# Patient Record
Sex: Female | Born: 1970 | Race: White | Hispanic: No | Smoking: Current some day smoker
Health system: Southern US, Community
[De-identification: ages and names within clinical notes are randomized; demographics above are authoritative.]

## PROBLEM LIST (undated history)

## (undated) HISTORY — PX: CHOLECYSTECTOMY OPEN: SUR202

---

## 2019-11-23 DIAGNOSIS — R5383 Other fatigue: Secondary | ICD-10-CM | POA: Insufficient documentation

## 2019-12-29 DIAGNOSIS — R5381 Other malaise: Secondary | ICD-10-CM | POA: Insufficient documentation

## 2019-12-30 ENCOUNTER — Other Ambulatory Visit: Payer: Self-pay

## 2019-12-30 ENCOUNTER — Encounter: Payer: Self-pay | Admitting: Cardiology

## 2019-12-30 ENCOUNTER — Ambulatory Visit: Payer: 59 | Admitting: Cardiology

## 2019-12-30 ENCOUNTER — Ambulatory Visit (INDEPENDENT_AMBULATORY_CARE_PROVIDER_SITE_OTHER): Payer: 59

## 2019-12-30 DIAGNOSIS — R002 Palpitations: Secondary | ICD-10-CM

## 2019-12-30 DIAGNOSIS — R0789 Other chest pain: Secondary | ICD-10-CM | POA: Diagnosis not present

## 2019-12-30 DIAGNOSIS — R011 Cardiac murmur, unspecified: Secondary | ICD-10-CM | POA: Diagnosis not present

## 2019-12-30 DIAGNOSIS — R0609 Other forms of dyspnea: Secondary | ICD-10-CM

## 2019-12-30 DIAGNOSIS — F17210 Nicotine dependence, cigarettes, uncomplicated: Secondary | ICD-10-CM

## 2019-12-30 DIAGNOSIS — R079 Chest pain, unspecified: Secondary | ICD-10-CM

## 2019-12-30 DIAGNOSIS — R0600 Dyspnea, unspecified: Secondary | ICD-10-CM

## 2019-12-30 NOTE — Progress Notes (Signed)
Cardiology Office Note:    Date:  12/30/2019   ID:  Alexis Mcintyre, DOB August 11, 1970, MRN [id number]  PCP:  Ninfa Meeker, FNP  Cardiologist:  Garwin Brothers, MD   Referring MD: Bobbye Morton, *    ASSESSMENT:    1. Palpitations   2. Cardiac murmur   3. Dyspnea on exertion   4. Chest discomfort   5. Cigarette smoker    PLAN:    In order of problems listed above:  1. Primary prevention stressed with the patient.  Importance of compliance with diet medication stressed and she vocalized understanding. 2. Cigarette smoker: I spent 5 minutes with the patient discussing solely about smoking. Smoking cessation was counseled. I suggested to the patient also different medications and pharmacological interventions. Patient is keen to try stopping on its own at this time. He will get back to me if he needs any further assistance in this matter. 3. Palpitations: Recent TSH was unremarkable.  I would like to do a 2-week monitoring on her to assess her symptoms. 4. Cardiac murmur: Echocardiogram will be done to assess murmur heard on auscultation. 5. Chest discomfort: Atypical in etiology and has some shortness of breath on exertion.  These are atypical however in view of risk factors we will do a Lexiscan sestamibi.  Patient knows to go to the nearest emergency room for any concerning symptoms. 6. Patient will be seen in follow-up appointment in 3 months or earlier if the patient has any concerns    Medication Adjustments/Labs and Tests Ordered: Current medicines are reviewed at length with the patient today.  Concerns regarding medicines are outlined above.  No orders of the defined types were placed in this encounter.  No orders of the defined types were placed in this encounter.    History of Present Illness:    Alexis Mcintyre is a 49 y.o. female who is being seen today for the evaluation of palpitations at the request of Street, Stephanie Coup, *.  Patient is a  pleasant 49 year old female.  She has past medical history of essential hypertension.  She mentions palpitations on and off she underwent ZIO monitoring for 1 day which revealed brief atrial run.  No diabetes mellitus.  Her lipids are unremarkable.  She smokes a pack and a half a day but has cut down to 3 to 4 cigarettes.  No orthopnea or PND.  No syncopal spell.  At the time of my evaluation she is alert awake oriented and in no distress.  She does not exercise on a regular basis.  Sexual activity does not bring her out any chest pain.  She has some shortness of breath with it.  At the time of my evaluation, the patient is alert awake oriented and in no distress.  History reviewed. No pertinent past medical history.  Past Surgical History:  Procedure Laterality Date   CHOLECYSTECTOMY OPEN      Current Medications: Current Meds  Medication Sig   Albuterol Sulfate (VENTOLIN HFA IN) Inhale 1-25 puffs into the lungs every 4 (four) hours as needed.   Budeson-Glycopyrrol-Formoterol (BREZTRI AEROSPHERE) 160-9-4.8 MCG/ACT AERO Inhale 2 puffs into the lungs 2 (two) times daily. In the morning and evening   buPROPion (WELLBUTRIN XL) 150 MG 24 hr tablet Take 1 tablet by mouth in the morning and at bedtime.   Multiple Vitamins-Minerals (MULTIVITAMIN ADULT, MINERALS,) TABS Take 1 tablet by mouth daily.   telmisartan-hydrochlorothiazide (MICARDIS HCT) 80-12.5 MG tablet Take 1 tablet by mouth daily. For  high blood pressure   [DISCONTINUED] buPROPion (ZYBAN) 150 MG 12 hr tablet Take 150 mg by mouth daily.   [DISCONTINUED] venlafaxine XR (EFFEXOR-XR) 37.5 MG 24 hr capsule Take 37.5 mg by mouth daily.     Allergies:   Patient has no known allergies.   Social History   Socioeconomic History   Marital status: Divorced    Spouse name: Not on file   Number of children: Not on file   Years of education: Not on file   Highest education level: Not on file  Occupational History   Not on file    Tobacco Use   Smoking status: Current Some Day Smoker    Types: Cigarettes   Smokeless tobacco: Never Used   Tobacco comment: 3 cigarettes a day  Vaping Use   Vaping Use: Never used  Substance and Sexual Activity   Alcohol use: Yes    Comment: social drink    Drug use: Not Currently   Sexual activity: Not on file  Other Topics Concern   Not on file  Social History Narrative   Not on file   Social Determinants of Health   Financial Resource Strain:    Difficulty of Paying Living Expenses:   Food Insecurity:    Worried About Programme researcher, broadcasting/film/video in the Last Year:    Barista in the Last Year:   Transportation Needs:    Freight forwarder (Medical):    Lack of Transportation (Non-Medical):   Physical Activity:    Days of Exercise per Week:    Minutes of Exercise per Session:   Stress:    Feeling of Stress :   Social Connections:    Frequency of Communication with Friends and Family:    Frequency of Social Gatherings with Friends and Family:    Attends Religious Services:    Active Member of Clubs or Organizations:    Attends Banker Meetings:    Marital Status:      Family History: The patient's family history includes Aneurysm in her sister; Heart attack in her sister; Pulmonary Hypertension in her mother; Suicidality in her brother.  ROS:   Please see the history of present illness.    All other systems reviewed and are negative.  EKGs/Labs/Other Studies Reviewed:    The following studies were reviewed today: I discussed my findings with the patient at length.  EKG reveals sinus rhythm and nonspecific ST-T changes.   Recent Labs: No results found for requested labs within last 8760 hours.  Recent Lipid Panel No results found for: CHOL, TRIG, HDL, CHOLHDL, VLDL, LDLCALC, LDLDIRECT  Physical Exam:    VS:  BP 122/80 (BP Location: Right Arm, Patient Position: Sitting)    Pulse 102    Ht 5\' 2"  (1.575 m)    Wt 139  lb 12.8 oz (63.4 kg)    SpO2 99%    BMI 25.57 kg/m     Wt Readings from Last 3 Encounters:  12/30/19 139 lb 12.8 oz (63.4 kg)     GEN: Patient is in no acute distress HEENT: Normal NECK: No JVD; No carotid bruits LYMPHATICS: No lymphadenopathy CARDIAC: S1 S2 regular, 2/6 systolic murmur at the apex. RESPIRATORY:  Clear to auscultation without rales, wheezing or rhonchi  ABDOMEN: Soft, non-tender, non-distended MUSCULOSKELETAL:  No edema; No deformity  SKIN: Warm and dry NEUROLOGIC:  Alert and oriented x 3 PSYCHIATRIC:  Normal affect    Signed, 01/01/20, MD  12/30/2019 10:22 AM  [facility]

## 2019-12-30 NOTE — Patient Instructions (Signed)
Medication Instructions:  Your physician recommends that you continue on your current medications as directed. Please refer to the Current Medication list given to you today.  *If you need a refill on your cardiac medications before your next appointment, please call your pharmacy*   Lab Work: None ordered   If you have labs (blood work) drawn today and your tests are completely normal, you will receive your results only by: Marland Kitchen MyChart Message (if you have MyChart) OR . A paper copy in the mail If you have any lab test that is abnormal or we need to change your treatment, we will call you to review the results.   Testing/Procedures: A zio monitor was ordered today. It will remain on for 14 days. You will then return monitor and event diary in provided box. It takes 1-2 weeks for report to be downloaded and returned to [place]. We will call you with the results. If monitor falls off or has orange flashing light, please call Zio for further instructions.   Your physician has requested that you have an echocardiogram. Echocardiography is a painless test that uses sound waves to create images of your heart. It provides your doctor with information about the size and shape of your heart and how well your heart's chambers and valves are working. This procedure takes approximately one hour. There are no restrictions for this procedure.  Your physician has requested that you have a lexiscan myoview. For further information please visit [email or web address] Please follow instruction sheet, as given.   Follow-Up: At [facility], you and your health needs are our priority.  As part of our continuing mission to provide you with exceptional heart care, we have created designated Provider Care Teams.  These Care Teams include your primary Cardiologist (physician) and Advanced Practice Providers (APPs -  Physician Assistants and Nurse Practitioners) who all work together to provide you with the care you need,  when you need it.  We recommend signing up for the patient portal called "MyChart".  Sign up information is provided on this After Visit Summary.  MyChart is used to connect with patients for Virtual Visits (Telemedicine).  Patients are able to view lab/test results, encounter notes, upcoming appointments, etc.  Non-urgent messages can be sent to your provider as well.   To learn more about what you can do with MyChart, go to [email or web address].    Your next appointment:   3 month(s)  The format for your next appointment:   In Person  Provider:   Belva Crome, MD   Other Instructions None

## 2020-01-14 ENCOUNTER — Telehealth (HOSPITAL_COMMUNITY): Payer: Self-pay | Admitting: *Deleted

## 2020-01-14 ENCOUNTER — Encounter (HOSPITAL_COMMUNITY): Payer: Self-pay | Admitting: *Deleted

## 2020-01-14 NOTE — Telephone Encounter (Signed)
Voicemail not set up.  Sent letter via My Chart outlining instructions for upcoming stress test on 01/20/20 @ 8:00.

## 2020-01-20 ENCOUNTER — Other Ambulatory Visit: Payer: Self-pay

## 2020-01-20 ENCOUNTER — Ambulatory Visit (INDEPENDENT_AMBULATORY_CARE_PROVIDER_SITE_OTHER): Payer: 59

## 2020-01-20 DIAGNOSIS — R079 Chest pain, unspecified: Secondary | ICD-10-CM

## 2020-01-20 DIAGNOSIS — R011 Cardiac murmur, unspecified: Secondary | ICD-10-CM

## 2020-01-20 DIAGNOSIS — R072 Precordial pain: Secondary | ICD-10-CM

## 2020-01-20 LAB — ECHOCARDIOGRAM COMPLETE
Area-P 1/2: 3.72 cm2
Calc EF: 43.1 %
Height: 62 in
S' Lateral: 3.1 cm
Single Plane A2C EF: 45 %
Single Plane A4C EF: 46.8 %
Weight: 2224 oz

## 2020-01-20 LAB — MYOCARDIAL PERFUSION IMAGING
LV dias vol: 79 mL (ref 46–106)
LV sys vol: 33 mL
Peak HR: 103 {beats}/min
Rest HR: 72 {beats}/min
SDS: 0
SRS: 2
SSS: 2
TID: 0.97

## 2020-01-20 MED ORDER — TECHNETIUM TC 99M TETROFOSMIN IV KIT
10.8000 | PACK | Freq: Once | INTRAVENOUS | Status: AC | PRN
  Administered 2020-01-20: 10.8 via INTRAVENOUS

## 2020-01-20 MED ORDER — TECHNETIUM TC 99M TETROFOSMIN IV KIT
31.5000 | PACK | Freq: Once | INTRAVENOUS | Status: AC | PRN
  Administered 2020-01-20: 31.5 via INTRAVENOUS

## 2020-01-20 MED ORDER — REGADENOSON 0.4 MG/5ML IV SOLN
0.4000 mg | Freq: Once | INTRAVENOUS | Status: AC
  Administered 2020-01-20: 0.4 mg via INTRAVENOUS

## 2020-01-20 NOTE — Progress Notes (Signed)
Complete echocardiogram performed.  Jimmy Audreyanna Butkiewicz RDCS, RVT  

## 2020-01-26 ENCOUNTER — Telehealth: Payer: Self-pay

## 2020-01-26 MED ORDER — METOPROLOL TARTRATE 100 MG PO TABS
100.0000 mg | ORAL_TABLET | Freq: Once | ORAL | 0 refills | Status: AC
Start: 2020-01-26 — End: 2020-01-26

## 2020-01-26 NOTE — Telephone Encounter (Signed)
Results reviewed with pt as per Dr. Revankar's note.  Pt verbalized understanding and had no additional questions. Routed to PCP.  

## 2020-01-26 NOTE — Progress Notes (Unsigned)
Results reviewed with pt as per Dr. Kem Parkinson note.  Pt verbalized understanding and had no additional questions. Routed to PCP. CT FFR ordered

## 2020-02-06 ENCOUNTER — Telehealth (HOSPITAL_COMMUNITY): Payer: Self-pay | Admitting: Emergency Medicine

## 2020-02-06 NOTE — Telephone Encounter (Signed)
Calling patient to request she have her lab work drawn for CCTA appt on 9/8. LMTCB Rockwell Alexandria RN Navigator Cardiac Imaging [facility] [phone] Office  [phone] Cell

## 2020-02-06 NOTE — Telephone Encounter (Signed)
Reaching out to patient to offer assistance regarding upcoming cardiac imaging study; pt verbalizes understanding of appt date/time, parking situation and where to check in, pre-test NPO status and medications ordered, and verified current allergies; name and call back number provided for further questions should they arise Orissa Arreaga RN Navigator Cardiac Imaging [facility] [phone] office [phone] cell 

## 2020-02-10 ENCOUNTER — Telehealth: Payer: Self-pay

## 2020-02-10 DIAGNOSIS — Z79899 Other long term (current) drug therapy: Secondary | ICD-10-CM

## 2020-02-10 LAB — HEPATIC FUNCTION PANEL
ALT: 13 IU/L (ref 0–32)
AST: 11 IU/L (ref 0–40)
Albumin: 4.4 g/dL (ref 3.8–4.8)
Alkaline Phosphatase: 112 IU/L (ref 48–121)
Bilirubin Total: 0.4 mg/dL (ref 0.0–1.2)
Bilirubin, Direct: 0.11 mg/dL (ref 0.00–0.40)
Total Protein: 6.7 g/dL (ref 6.0–8.5)

## 2020-02-10 LAB — BASIC METABOLIC PANEL
BUN/Creatinine Ratio: 18 (ref 9–23)
BUN: 16 mg/dL (ref 6–24)
CO2: 30 mmol/L — ABNORMAL HIGH (ref 20–29)
Calcium: 9.5 mg/dL (ref 8.7–10.2)
Chloride: 99 mmol/L (ref 96–106)
Creatinine, Ser: 0.87 mg/dL (ref 0.57–1.00)
GFR calc Af Amer: 91 mL/min/{1.73_m2} (ref >59)
GFR calc non Af Amer: 79 mL/min/{1.73_m2} (ref >59)
Glucose: 101 mg/dL — ABNORMAL HIGH (ref 65–99)
Potassium: 4.4 mmol/L (ref 3.5–5.2)
Sodium: 139 mmol/L (ref 134–144)

## 2020-02-10 LAB — CBC
Hematocrit: 39.4 % (ref 34.0–46.6)
Hemoglobin: 13.5 g/dL (ref 11.1–15.9)
MCH: 29.9 pg (ref 26.6–33.0)
MCHC: 34.3 g/dL (ref 31.5–35.7)
MCV: 87 fL (ref 79–97)
Platelets: 326 10*3/uL (ref 150–450)
RBC: 4.51 x10E6/uL (ref 3.77–5.28)
RDW: 11.8 % (ref 11.7–15.4)
WBC: 4.2 10*3/uL (ref 3.4–10.8)

## 2020-02-10 LAB — LIPID PANEL
Chol/HDL Ratio: 2.9 ratio (ref 0.0–4.4)
Cholesterol, Total: 176 mg/dL (ref 100–199)
HDL: 60 mg/dL (ref >39)
LDL Chol Calc (NIH): 94 mg/dL (ref 0–99)
Triglycerides: 126 mg/dL (ref 0–149)
VLDL Cholesterol Cal: 22 mg/dL (ref 5–40)

## 2020-02-10 LAB — TSH: TSH: 1.07 u[IU]/mL (ref 0.450–4.500)

## 2020-02-10 NOTE — Telephone Encounter (Signed)
Lab orders in

## 2020-02-11 ENCOUNTER — Ambulatory Visit (HOSPITAL_COMMUNITY)
Admission: RE | Admit: 2020-02-11 | Discharge: 2020-02-11 | Disposition: A | Discharge status: Home / Self Care | Payer: 59 | Source: Ambulatory Visit | Attending: Cardiology | Admitting: Cardiology

## 2020-02-11 ENCOUNTER — Other Ambulatory Visit: Payer: Self-pay

## 2020-02-11 ENCOUNTER — Telehealth: Payer: Self-pay

## 2020-02-11 DIAGNOSIS — R072 Precordial pain: Secondary | ICD-10-CM

## 2020-02-11 IMAGING — CT CT HEART MORP W/ CTA COR W/ SCORE W/ CA W/CM &/OR W/O CM
4 of 7 series · 8 of 20 positions shown, 9 images · IV contrast (APPLIED)
Comparison: None.
COMPARISON: None.

Addendum:
EXAM:
OVER-READ INTERPRETATION  CT CHEST

The following report is an over-read performed by radiologist Dr.
DJAN [REDACTED] on [DATE]. This
over-read does not include interpretation of cardiac or coronary
anatomy or pathology. The coronary calcium score/coronary CTA
interpretation by the cardiologist is attached.
CLINICAL DATA: 49 year old female with chest pain and shortness of
breath. Medical history includes Tobacco use.
Cardiac/Coronary  CT
TECHNIQUE: The patient was scanned on a Phillips Force scanner.

[Series 6: best diast 75 % · axial · 0.39mm/px · z∈[-147,-105]mm · 2 of 318 slices shown]
[im 106/318  vessel]
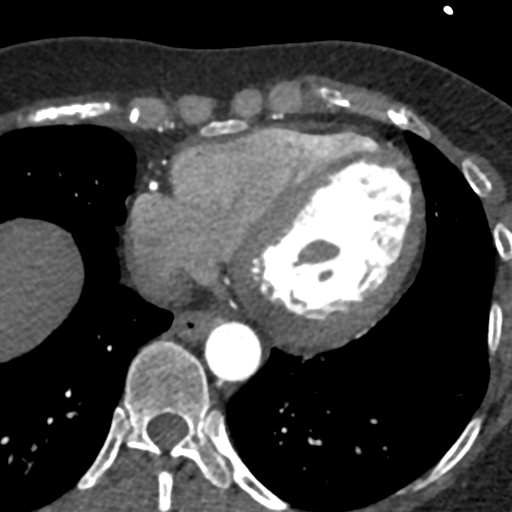
[im 212/318  vessel]
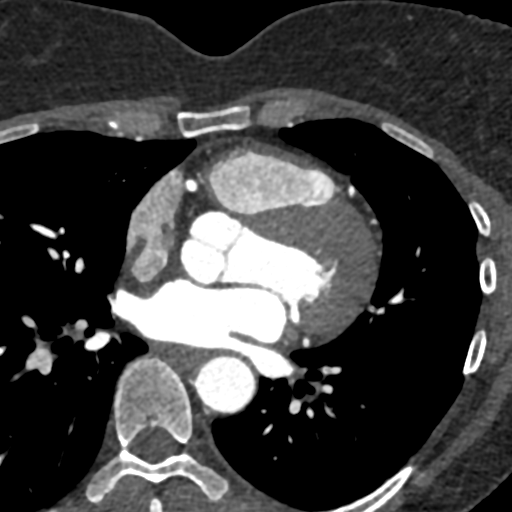

[Series 7: best syst · axial · 0.39mm/px · z∈[-147,-105]mm · 2 of 318 slices shown, 3 images]
[im 106/318  vessel]
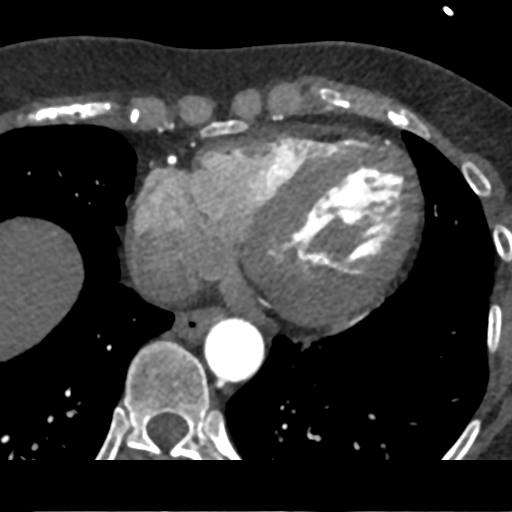
[im 106/318  lung]
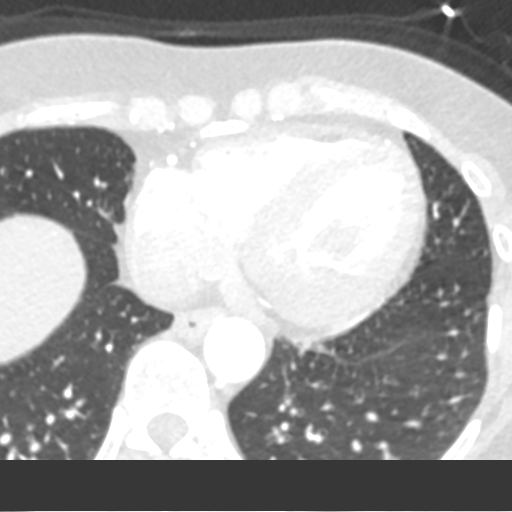
[im 212/318  vessel]
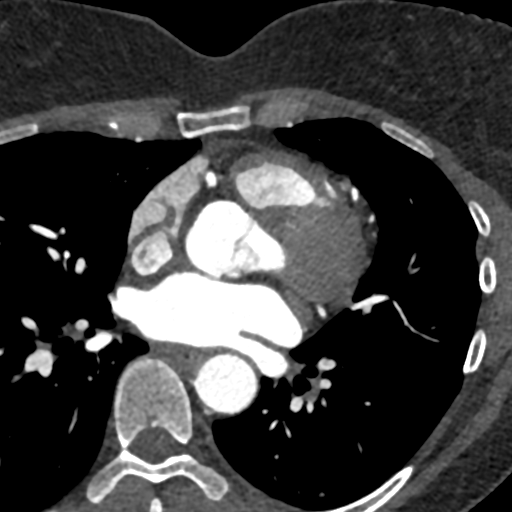

[Series 9: ts diast sharp 75 % · axial · 0.39mm/px · z∈[-147,-105]mm · 2 of 318 slices shown]
[im 106/318  lung]
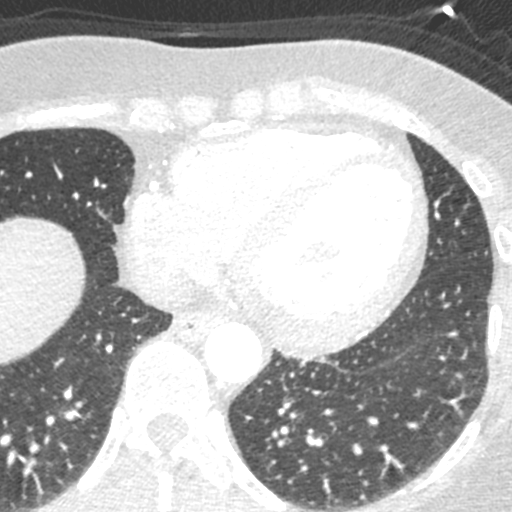
[im 212/318  lung]
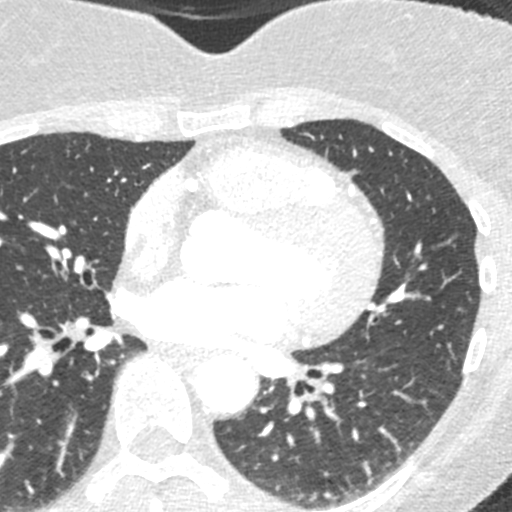

[Series 10: ts syst sharp · axial · 0.39mm/px · z∈[-147,-105]mm · 2 of 318 slices shown]
[im 106/318  lung]
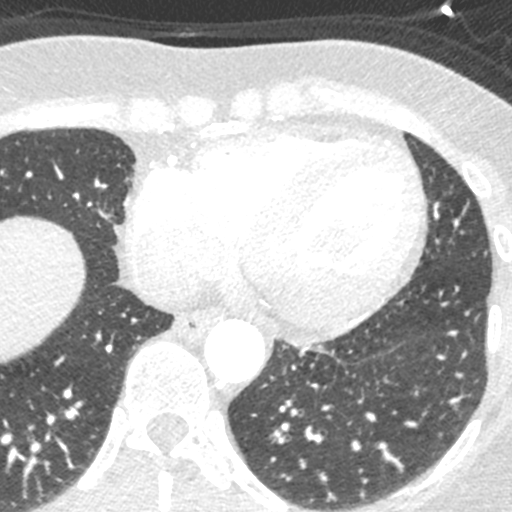
[im 212/318  lung]
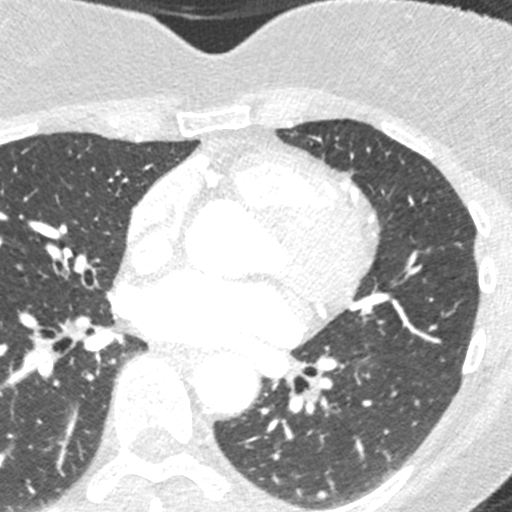

[8 of 20 positions shown; findings below may reference images not displayed]

FINDINGS: Within the visualized portions of the thorax there are no suspicious
appearing pulmonary nodules or masses, there is no acute
consolidative airspace disease, no pleural effusions, no
pneumothorax and no lymphadenopathy. Visualized portions of the
upper abdomen are unremarkable. There are no aggressive appearing
lytic or blastic lesions noted in the visualized portions of the
skeleton.
IMPRESSION: No significant incidental noncardiac findings are noted.
FINDINGS: A 120 kV prospective scan was triggered in the descending thoracic
aorta at 111 HU's. Axial non-contrast 3 mm slices were carried out
through the heart. The data set was analyzed on a dedicated work
station and scored using the Agatson method. Gantry rotation speed
was 250 msecs and collimation was .6 mm. No beta blockade and 0.8 mg
of sl NTG was given. The 3D data set was reconstructed in 5%
intervals of the 67-82 % of the R-R cycle. Diastolic phases were
analyzed on a dedicated work station using MPR, MIP and VRT modes.
The patient received 80 cc of contrast.

Aorta: Normal size.  No calcifications.  No dissection.

Aortic Valve:  Trileaflet.  No calcifications.

Coronary Arteries:  Normal coronary origin.  Right dominance.

RCA is a large dominant artery that gives rise to PDA and PLVB.
There is minimal calcified plaque in the mid RCA. The proximal and
distal portion of the vessel with no plaques.

Left main is a large artery that gives rise to LAD and LCX arteries.

LAD is a large vessel that has no plaque.

LCX is a non-dominant artery that gives rise to one large OM1
branch. There is no plaque.

Other findings:

Normal pulmonary vein drainage into the left atrium.

Normal left atrial appendage without a thrombus.

Normal size of the pulmonary artery.
IMPRESSION: 1. Coronary calcium score of 24. This was 95 percentile for age and
sex matched control.

2. Normal coronary origin with right dominance.

3. Minimal Coronary artery disease.

DJAN, DO

*** End of Addendum ***
EXAM:
OVER-READ INTERPRETATION  CT CHEST

The following report is an over-read performed by radiologist Dr.
DJAN [REDACTED] on [DATE]. This
over-read does not include interpretation of cardiac or coronary
anatomy or pathology. The coronary calcium score/coronary CTA
interpretation by the cardiologist is attached.
FINDINGS: Within the visualized portions of the thorax there are no suspicious
appearing pulmonary nodules or masses, there is no acute
consolidative airspace disease, no pleural effusions, no
pneumothorax and no lymphadenopathy. Visualized portions of the
upper abdomen are unremarkable. There are no aggressive appearing
lytic or blastic lesions noted in the visualized portions of the
skeleton.
IMPRESSION: No significant incidental noncardiac findings are noted.

## 2020-02-11 MED ORDER — NITROGLYCERIN 0.4 MG SL SUBL: 0.8000 mg | SUBLINGUAL_TABLET | Freq: Once | SUBLINGUAL | Status: AC

## 2020-02-11 MED ORDER — NITROGLYCERIN 0.4 MG SL SUBL
SUBLINGUAL_TABLET | SUBLINGUAL | Status: AC
  Administered 2020-02-11: 0.8 mg via SUBLINGUAL
  Filled 2020-02-11: qty 2

## 2020-02-11 MED ORDER — METOPROLOL TARTRATE 5 MG/5ML IV SOLN
INTRAVENOUS | Status: AC
  Administered 2020-02-11: 10 mg via INTRAVENOUS
  Filled 2020-02-11: qty 20

## 2020-02-11 MED ORDER — IOHEXOL 350 MG/ML SOLN
80.0000 mL | Freq: Once | INTRAVENOUS | Status: AC | PRN
  Administered 2020-02-11: 80 mL via INTRAVENOUS

## 2020-02-11 NOTE — Telephone Encounter (Signed)
Spoke with patient regarding results and recommendation.  Patient verbalizes understanding and is agreeable to plan of care. Advised patient to call back with any issues or concerns.  

## 2020-02-11 NOTE — Telephone Encounter (Signed)
-----   Message from Garwin Brothers, MD sent at 02/10/2020  4:44 PM EDT ----- The results of the study is unremarkable. Please inform patient. I will discuss in detail at next appointment. Cc  primary care/referring physician Garwin Brothers, MD 02/10/2020 4:43 PM

## 2020-02-12 ENCOUNTER — Telehealth: Payer: Self-pay

## 2020-02-12 DIAGNOSIS — I2510 Atherosclerotic heart disease of native coronary artery without angina pectoris: Secondary | ICD-10-CM

## 2020-02-12 MED ORDER — ATORVASTATIN CALCIUM 10 MG PO TABS: 10.0000 mg | ORAL_TABLET | Freq: Every day | ORAL | 3 refills | Status: AC

## 2020-02-12 MED ORDER — ASPIRIN EC 81 MG PO TBEC: 81.0000 mg | DELAYED_RELEASE_TABLET | Freq: Every day | ORAL | 3 refills | Status: AC

## 2020-02-12 NOTE — Telephone Encounter (Signed)
Spoke with patient regarding results and recommendation.  Patient verbalizes understanding and is agreeable to plan of care. Advised patient to call back with any issues or concerns.  

## 2020-02-12 NOTE — Telephone Encounter (Signed)
-----   Message from Garwin Brothers, MD sent at 02/12/2020  1:43 PM EDT ----- Mild nonobstructive coronary artery disease.  Start atorvastatin 10 mg daily and liver lipid check in 6 weeks.  Diet and exercise.  Coated aspirin 81 mg daily.  Copy to primary care. Garwin Brothers, MD 02/12/2020 1:42 PM

## 2020-03-31 ENCOUNTER — Ambulatory Visit: Payer: 59 | Admitting: Cardiology
